# Patient Record
Sex: Female | Born: 1940 | Race: White | Hispanic: No | Marital: Single | Smoking: Never smoker
Health system: Southern US, Community
[De-identification: ages and names within clinical notes are randomized; demographics above are authoritative.]

---

## 2016-04-30 ENCOUNTER — Encounter: Payer: Self-pay | Admitting: *Deleted

## 2016-04-30 ENCOUNTER — Ambulatory Visit
Admission: EM | Admit: 2016-04-30 | Discharge: 2016-04-30 | Disposition: A | Discharge status: Home / Self Care | Payer: Medicare Other | Attending: Internal Medicine | Admitting: Internal Medicine

## 2016-04-30 DIAGNOSIS — J111 Influenza due to unidentified influenza virus with other respiratory manifestations: Secondary | ICD-10-CM

## 2016-04-30 DIAGNOSIS — R69 Illness, unspecified: Secondary | ICD-10-CM | POA: Diagnosis not present

## 2016-04-30 LAB — RAPID INFLUENZA A&B ANTIGENS: Influenza B (ARMC): NEGATIVE

## 2016-04-30 LAB — RAPID STREP SCREEN (MED CTR MEBANE ONLY): Streptococcus, Group A Screen (Direct): NEGATIVE

## 2016-04-30 LAB — RAPID INFLUENZA A&B ANTIGENS (ARMC ONLY): INFLUENZA A (ARMC): NEGATIVE

## 2016-04-30 MED ORDER — BENZONATATE 200 MG PO CAPS: 200.0000 mg | ORAL_CAPSULE | Freq: Three times a day (TID) | ORAL | 1 refills | Status: AC | PRN

## 2016-04-30 MED ORDER — PREDNISONE 50 MG PO TABS: 50.0000 mg | ORAL_TABLET | Freq: Every day | ORAL | 0 refills | Status: DC

## 2016-04-30 MED ORDER — TRIAMCINOLONE ACETONIDE 55 MCG/ACT NA AERO: 2.0000 | INHALATION_SPRAY | Freq: Every day | NASAL | 0 refills | Status: AC

## 2016-04-30 NOTE — Discharge Instructions (Addendum)
Rapid strep test and influenza tests were negative.  Symptoms today seem most consistent with an influenza-like illness, probably viral, causing sinus and chest congestion and drainage.  Prescriptions for prednisone (steroid, for chest tightness/cough/congestion), benzonatate (for cough) and a nasal steroid spray (for sinus congestion) were sent to the [facility].  Recheck for persistent fever >100.5, increasing phlegm production/nasal discharge, or if not starting to feel better in a few days.   °

## 2016-04-30 NOTE — ED Triage Notes (Signed)
C/o sore throat, fever, chills, body aches, productive cough- yellow x3-4 days.

## 2016-04-30 NOTE — ED Provider Notes (Signed)
[facility]    CSN: [id number] Arrival date & time: 04/30/16  1125     History   Chief Complaint Chief Complaint  Patient presents with  . Cough  . Fever  . Sore Throat  . Otalgia    HPI Sharon Fernandez is a 75 y.o. female. She presents today with 3-4 d hx bad head congestion, mouth breathing, chest tightness.  Malaise.  Sore throat.  Hoarseness.  Achiness and chills.  Bad headaches, tactile temp.  No vomiting.  Little bit of diarrhea.  Runny nose.     HPI  History reviewed. No pertinent past medical history.   Past Surgical History:  Procedure Laterality Date  . CESAREAN SECTION         Home Medications    Prior to Admission medications   Medication Sig Start Date End Date Taking? Authorizing Provider  alendronate (FOSAMAX) 35 MG tablet Take 35 mg by mouth every 7 (seven) days. Take with a full glass of water on an empty stomach.   Yes Historical Provider, MD  sertraline (ZOLOFT) 50 MG tablet Take 50 mg by mouth daily.   Yes Historical Provider, MD  benzonatate (TESSALON) 200 MG capsule Take 1 capsule (200 mg total) by mouth 3 (three) times daily as needed for cough. 04/30/16   Eustace MooreLaura W Syana Degraffenreid, MD  predniSONE (DELTASONE) 50 MG tablet Take 1 tablet (50 mg total) by mouth daily. 04/30/16   Eustace MooreLaura W Sabriya Yono, MD  triamcinolone (NASACORT AQ) 55 MCG/ACT AERO nasal inhaler Place 2 sprays into the nose daily. 04/30/16   Eustace MooreLaura W Flavia Bruss, MD    Family History History reviewed. No pertinent family history.  Social History Social History  Substance Use Topics  . Smoking status: Never Smoker  . Smokeless tobacco: Never Used  . Alcohol use No     Allergies   Patient has no known allergies.   Review of Systems Review of Systems  All other systems reviewed and are negative.    Physical Exam Triage Vital Signs ED Triage Vitals  Enc Vitals Group     BP 04/30/16 1335 131/69     Pulse Rate 04/30/16 1335 (!) 103     Resp 04/30/16 1335 20     Temp 04/30/16  1335 99.2 F (37.3 C)     Temp Source 04/30/16 1335 Oral     SpO2 04/30/16 1335 94 %     Weight 04/30/16 1337 174 lb (78.9 kg)     Height 04/30/16 1337 5\' 3"  (1.6 m)     Pain Score --    Updated Vital Signs BP 131/69 (BP Location: Left Arm)   Pulse (!) 103   Temp 99.2 F (37.3 C) (Oral)   Resp 20   Ht 5\' 3"  (1.6 m)   Wt 174 lb (78.9 kg)   SpO2 94%   BMI 30.82 kg/m  Physical Exam  Constitutional: She is oriented to person, place, and time.  Alert, nicely groomed Looks ill but not toxic  HENT:  Head: Atraumatic.  B TMs are quite dull, red-tinged Marked nasal congestion Throat red, with post nasal drainage  Eyes:  Conjugate gaze, no eye redness/drainage  Neck: Neck supple.  Cardiovascular: Normal rate and regular rhythm.   Pulmonary/Chest: No respiratory distress.  Slightly coarse but symmetric breath sounds throughout  Abdominal: She exhibits no distension.  Musculoskeletal: Normal range of motion.  No leg swelling  Neurological: She is alert and oriented to person, place, and time.  Skin: Skin is warm and  dry.  No cyanosis  Nursing note and vitals reviewed.    UC Treatments / Results  Labs Results for orders placed or performed during the hospital encounter of 04/30/16  Rapid Influenza A&B Antigens ([facility] only)  Result Value Ref Range   Influenza A ([facility]) NEGATIVE NEGATIVE   Influenza B ([facility]) NEGATIVE NEGATIVE  Rapid strep screen  Result Value Ref Range   Streptococcus, Group A Screen (Direct) NEGATIVE NEGATIVE  Culture, group A strep  Result Value Ref Range   Specimen Description THROAT    Special Requests NONE Reflexed from [id number]    Culture      NO GROUP A STREP (S.PYOGENES) ISOLATED [facility]    Report Status 05/02/2016 FINAL     Procedures Procedures (including critical care time) None today  Final Clinical Impressions(s) / UC Diagnoses   Final diagnoses:  Influenza-like illness   Rapid strep test and influenza tests  were negative.  Symptoms today seem most consistent with an influenza-like illness, probably viral, causing sinus and chest congestion and drainage.  Prescriptions for prednisone (steroid, for chest tightness/cough/congestion), benzonatate (for cough) and a nasal steroid spray (for sinus congestion) were sent to the [facility].  Recheck for persistent fever >100.5, increasing phlegm production/nasal discharge, or if not starting to feel better in a few days.    New Prescriptions Discharge Medication List as of 04/30/2016  2:23 PM    START taking these medications   Details  benzonatate (TESSALON) 200 MG capsule Take 1 capsule (200 mg total) by mouth 3 (three) times daily as needed for cough., Starting Tue 04/30/2016, Normal    predniSONE (DELTASONE) 50 MG tablet Take 1 tablet (50 mg total) by mouth daily., Starting Tue 04/30/2016, Normal    triamcinolone (NASACORT AQ) 55 MCG/ACT AERO nasal inhaler Place 2 sprays into the nose daily., Starting Tue 04/30/2016, Normal         Eustace MooreLaura W Donovon Micheletti, MD 05/03/16 1009

## 2016-05-02 LAB — CULTURE, GROUP A STREP (THRC)

## 2016-05-03 ENCOUNTER — Ambulatory Visit
Admission: EM | Admit: 2016-05-03 | Discharge: 2016-05-03 | Disposition: A | Discharge status: Home / Self Care | Payer: Medicare Other | Attending: Family Medicine | Admitting: Family Medicine

## 2016-05-03 ENCOUNTER — Ambulatory Visit (INDEPENDENT_AMBULATORY_CARE_PROVIDER_SITE_OTHER): Payer: Medicare Other

## 2016-05-03 DIAGNOSIS — J181 Lobar pneumonia, unspecified organism: Principal | ICD-10-CM

## 2016-05-03 DIAGNOSIS — J189 Pneumonia, unspecified organism: Secondary | ICD-10-CM | POA: Diagnosis not present

## 2016-05-03 MED ORDER — PREDNISONE 10 MG (21) PO TBPK: ORAL_TABLET | ORAL | 0 refills | Status: DC

## 2016-05-03 MED ORDER — HYDROCOD POLST-CPM POLST ER 10-8 MG/5ML PO SUER: 5.0000 mL | Freq: Two times a day (BID) | ORAL | 0 refills | Status: AC | PRN

## 2016-05-03 MED ORDER — ALBUTEROL SULFATE HFA 108 (90 BASE) MCG/ACT IN AERS: 2.0000 | INHALATION_SPRAY | RESPIRATORY_TRACT | 1 refills | Status: AC | PRN

## 2016-05-03 MED ORDER — LEVOFLOXACIN 500 MG PO TABS: 500.0000 mg | ORAL_TABLET | Freq: Every day | ORAL | 0 refills | Status: DC

## 2016-05-03 NOTE — ED Provider Notes (Signed)
[facility]    CSN: [id number] Arrival date & time: 05/03/16  1130     History   Chief Complaint Chief Complaint  Patient presents with  . Cough    HPI Sharon Fernandez is a 75 y.o. female.   Patient's here because of cough and congestion she reports having this cough and congestion that started a day to before Christmas. She was seen here the day after Christmas given medication for viral URI she states that things are just gotten worse she feels worse she feels a lot worse in fact she reports increased coughing and nasal congestion and low-grade fever. She reports a nonproductive hacking cough is keeping her up aching all over and feeling miserable.   The history is provided by the patient. No language interpreter was used.  Cough  Cough characteristics:  Non-productive Sputum characteristics:  Unable to specify Severity:  Moderate Onset quality:  Sudden Timing:  Constant Progression:  Worsening Chronicity:  New Smoker: no   Context: sick contacts and upper respiratory infection   Relieved by:  Nothing Worsened by:  Deep breathing and lying down Ineffective treatments:  None tried Associated symptoms: myalgias, shortness of breath, sinus congestion and wheezing   Associated symptoms: no chest pain, no chills, no diaphoresis, no ear fullness, no ear pain, no sore throat and no weight loss     History reviewed. No pertinent past medical history.  There are no active problems to display for this patient.   Past Surgical History:  Procedure Laterality Date  . CESAREAN SECTION      OB History    No data available       Home Medications    Prior to Admission medications   Medication Sig Start Date End Date Taking? Authorizing Provider  alendronate (FOSAMAX) 35 MG tablet Take 35 mg by mouth every 7 (seven) days. Take with a full glass of water on an empty stomach.   Yes Historical Provider, MD  benzonatate (TESSALON) 200 MG capsule Take 1 capsule (200  mg total) by mouth 3 (three) times daily as needed for cough. 04/30/16  Yes Eustace Moore, MD  predniSONE (DELTASONE) 50 MG tablet Take 1 tablet (50 mg total) by mouth daily. 04/30/16  Yes Eustace Moore, MD  sertraline (ZOLOFT) 50 MG tablet Take 50 mg by mouth daily.   Yes Historical Provider, MD  triamcinolone (NASACORT AQ) 55 MCG/ACT AERO nasal inhaler Place 2 sprays into the nose daily. 04/30/16  Yes Eustace Moore, MD  albuterol (PROVENTIL HFA;VENTOLIN HFA) 108 (90 Base) MCG/ACT inhaler Inhale 2 puffs into the lungs every 4 (four) hours as needed for wheezing or shortness of breath. 05/03/16   Hassan Rowan, MD  chlorpheniramine-HYDROcodone [facility] PENNKINETIC ER) 10-8 MG/5ML SUER Take 5 mLs by mouth every 12 (twelve) hours as needed for cough. 05/03/16   Hassan Rowan, MD  levofloxacin (LEVAQUIN) 500 MG tablet Take 1 tablet (500 mg total) by mouth daily. 05/03/16   Hassan Rowan, MD  predniSONE (STERAPRED UNI-PAK 21 TAB) 10 MG (21) TBPK tablet Sig 6 tablet day 1, 5 tablets day 2, 4 tablets day 3,,3tablets day 4, 2 tablets day 5, 1 tablet day 6 take all tablets orally 05/03/16   Hassan Rowan, MD    Family History History reviewed. No pertinent family history.  Social History Social History  Substance Use Topics  . Smoking status: Never Smoker  . Smokeless tobacco: Never Used  . Alcohol use No     Allergies  Patient has no known allergies.   Review of Systems Review of Systems  Constitutional: Negative for chills, diaphoresis and weight loss.  HENT: Negative for ear pain and sore throat.   Respiratory: Positive for cough, shortness of breath and wheezing.   Cardiovascular: Negative for chest pain.  Musculoskeletal: Positive for myalgias.  All other systems reviewed and are negative.    Physical Exam Triage Vital Signs ED Triage Vitals  Enc Vitals Group     BP 05/03/16 1230 117/62     Pulse Rate 05/03/16 1230 95     Resp 05/03/16 1230 19     Temp 05/03/16 1230 98.1 F  (36.7 C)     Temp Source 05/03/16 1230 Oral     SpO2 05/03/16 1230 92 %     Weight 05/03/16 1228 174 lb (78.9 kg)     Height 05/03/16 1228 5\' 3"  (1.6 m)     Head Circumference --      Peak Flow --      Pain Score 05/03/16 1230 6     Pain Loc --      Pain Edu? --      Excl. in GC? --    No data found.   Updated Vital Signs BP 117/62 (BP Location: Left Arm)   Pulse 95   Temp 98.1 F (36.7 C) (Oral)   Resp 19   Ht 5\' 3"  (1.6 m)   Wt 174 lb (78.9 kg)   SpO2 92%   BMI 30.82 kg/m   Visual Acuity Right Eye Distance:   Left Eye Distance:   Bilateral Distance:    Right Eye Near:   Left Eye Near:    Bilateral Near:     Physical Exam  Constitutional: She is oriented to person, place, and time. She appears well-developed and well-nourished.  HENT:  Head: Normocephalic and atraumatic.  Eyes: Pupils are equal, round, and reactive to light.  Neck: Normal range of motion. No thyromegaly present.  Cardiovascular: Normal rate.   Pulmonary/Chest: Effort normal. She has wheezes.  Musculoskeletal: Normal range of motion. She exhibits no edema or deformity.  Neurological: She is alert and oriented to person, place, and time. No cranial nerve deficit.  Skin: Skin is warm.  Psychiatric: She has a normal mood and affect.  Vitals reviewed.    UC Treatments / Results  Labs (all labs ordered are listed, but only abnormal results are displayed) Labs Reviewed - No data to display  EKG  EKG Interpretation None       Radiology Dg Chest 2 View  Result Date: 05/03/2016 CLINICAL DATA:  Shortness of breath and productive cough for 1 week. EXAM: CHEST  2 VIEW COMPARISON:  None. FINDINGS: The cardiac silhouette, mediastinal and hilar contours are within normal limits. Mild tortuosity of the thoracic aorta. There are bilateral basilar infiltrates. Linear scarring or atelectasis noted in the right lung. No pleural effusion. The bony thorax is intact. IMPRESSION: Bibasilar infiltrates.  Electronically Signed   By: Rudie MeyerP.  Gallerani M.D.   On: 05/03/2016 13:29    Procedures Procedures (including critical care time)  Medications Ordered in UC Medications - No data to display   Initial Impression / Assessment and Plan / UC Course  I have reviewed the triage vital signs and the nursing notes.  Pertinent labs & imaging results that were available during my care of the patient were reviewed by me and considered in my medical decision making (see chart for details).  Clinical Course  Patient informed about the abnormal chest x-ray indicating bibasilar pneumonia. Because of the pneumonia will place on Levaquin 500 mg 1 tablet day for 10 days albuterol inhaler 2 puffs every 2-4 hours when necessary as needed for bronchospasm Tussionex 1 teaspoon twice a day and will place on 6 day course of prednisone. She supposed to try file next Wednesday but explained to the following Monday fluids and notify their lines and get another flight out. Pop PCP when she is back to [place] in 2 weeks.   Final Clinical Impressions(s) / UC Diagnoses   Final diagnoses:  Pneumonia of both lower lobes due to infectious organism  Community acquired pneumonia, unspecified laterality    New Prescriptions Discharge Medication List as of 05/03/2016  2:04 PM    START taking these medications   Details  albuterol (PROVENTIL HFA;VENTOLIN HFA) 108 (90 Base) MCG/ACT inhaler Inhale 2 puffs into the lungs every 4 (four) hours as needed for wheezing or shortness of breath., Starting Fri 05/03/2016, Normal    chlorpheniramine-HYDROcodone (TUSSIONEX PENNKINETIC ER) 10-8 MG/5ML SUER Take 5 mLs by mouth every 12 (twelve) hours as needed for cough., Starting Fri 05/03/2016, Normal    levofloxacin (LEVAQUIN) 500 MG tablet Take 1 tablet (500 mg total) by mouth daily., Starting Fri 05/03/2016, Normal    predniSONE (STERAPRED UNI-PAK 21 TAB) 10 MG (21) TBPK tablet Sig 6 tablet day 1, 5 tablets day 2, 4 tablets day  3,,3tablets day 4, 2 tablets day 5, 1 tablet day 6 take all tablets orally, Normal         Note: This dictation was prepared with Dragon dictation along with smaller phrase technology. Any transcriptional errors that result from this process are unintentional.   Hassan RowanEugene Marabeth Melland, MD 05/03/16 [phone]

## 2016-05-03 NOTE — ED Triage Notes (Signed)
Patient complains of cough and congestion and sore throat with body aches x 7 days. Patient states that she feels very weak. Patient states that she has not improved since last visit here on Tuesday.

## 2016-05-14 ENCOUNTER — Ambulatory Visit
Admission: EM | Admit: 2016-05-14 | Discharge: 2016-05-14 | Disposition: A | Discharge status: Home / Self Care | Payer: Medicare Other | Attending: Family Medicine | Admitting: Family Medicine

## 2016-05-14 ENCOUNTER — Encounter: Payer: Self-pay | Admitting: Emergency Medicine

## 2016-05-14 DIAGNOSIS — M549 Dorsalgia, unspecified: Secondary | ICD-10-CM | POA: Insufficient documentation

## 2016-05-14 DIAGNOSIS — R0602 Shortness of breath: Secondary | ICD-10-CM | POA: Diagnosis not present

## 2016-05-14 NOTE — ED Provider Notes (Signed)
[facility] ____________________________________________  Time seen: Approximately 12:31 PM  I have reviewed the triage vital signs and the nursing notes.   HISTORY  Chief Complaint Cough and Back Pain   HPI Sharon Fernandez is a 76 y.o. female presenting with son at bedside for evaluation of back pain. Patient reports recent urgent care visits, initially diagnosed with a viral illness, and then diagnosed with bilateral pneumonia on 05/03/2016. Patient reports she finished her antibiotic prescription this past weekend which was Levaquin. Patient reports she is also since finish her cough medication. Patient reports she does still intermittently use inhaler. Patient reports this past Monday during the night she had onset of right lower "lung pain". Patient reports the next day she then had bilateral" lung pain ". Patient reports that she does still have some coughing but that the cough is much improved. Reports some accompanying shortness of breath. She reports that pain is present with deep breath to her back. Patient reports pain does improve with standing upright, but states pain is not increased with movement.  Denies fevers this week or chills. Denies lower extremity swelling or pain. Denies fall or injury. Denies heavy lifting or known trigger for pain. Denies history of same. Denies history of kidney stones or renal issues. Denies chest pain, rash, neck or lower back pain, dysuria or abdominal pain. Reports continues to drink and eat well overall. Patient reports she has not been as active over the last few weeks with her recent sickness. Reports flu to [place] just prior to Christmas and is planning to return home soon.  PCP: In [place]   Past medical history. Anxiety  There are no active problems to display for this patient.   Past Surgical History:  Procedure Laterality Date  . CESAREAN SECTION      No current facility-administered medications for this  encounter.   Current Outpatient Prescriptions:  .  albuterol (PROVENTIL HFA;VENTOLIN HFA) 108 (90 Base) MCG/ACT inhaler, Inhale 2 puffs into the lungs every 4 (four) hours as needed for wheezing or shortness of breath., Disp: 1 Inhaler, Rfl: 1 .  alendronate (FOSAMAX) 35 MG tablet, Take 35 mg by mouth every 7 (seven) days. Take with a full glass of water on an empty stomach., Disp: , Rfl:  .  benzonatate (TESSALON) 200 MG capsule, Take 1 capsule (200 mg total) by mouth 3 (three) times daily as needed for cough., Disp: 30 capsule, Rfl: 1 .  chlorpheniramine-HYDROcodone (TUSSIONEX PENNKINETIC ER) 10-8 MG/5ML SUER, Take 5 mLs by mouth every 12 (twelve) hours as needed for cough., Disp: 115 mL, Rfl: 0 .  sertraline (ZOLOFT) 50 MG tablet, Take 50 mg by mouth daily., Disp: , Rfl:  .  triamcinolone (NASACORT AQ) 55 MCG/ACT AERO nasal inhaler, Place 2 sprays into the nose daily., Disp: 1 Inhaler, Rfl: 0  Allergies Patient has no known allergies.  family history Father: High blood pressure Denies family history or personal history of DVT or PEs.  Social History Social History  Substance Use Topics  . Smoking status: Never Smoker  . Smokeless tobacco: Never Used  . Alcohol use No    Review of Systems Constitutional: As above.  Eyes: No visual changes. ENT: No sore throat. Cardiovascular: Denies chest pain. Respiratory:  As above.  Gastrointestinal: No abdominal pain.  No nausea, no vomiting.  No diarrhea.  No constipation. Genitourinary: Negative for dysuria. Musculoskeletal: Negative for back pain. Skin: Negative for rash. Neurological: Negative for headaches, focal weakness or numbness.  10-point ROS  otherwise negative.  ____________________________________________   PHYSICAL EXAM:  VITAL SIGNS: ED Triage Vitals  Enc Vitals Group     BP 05/14/16 1027 125/65     Pulse Rate 05/14/16 1027 91     Resp 05/14/16 1027 17     Temp 05/14/16 1027 97.6 F (36.4 C)     Temp Source  05/14/16 1027 Oral     SpO2 05/14/16 1027 97 %     Weight 05/14/16 1027 167 lb (75.8 kg)     Height 05/14/16 1027 5\' 3"  (1.6 m)     Head Circumference --      Peak Flow --      Pain Score 05/14/16 1029 0     Pain Loc --      Pain Edu? --      Excl. in GC? --     Constitutional: Alert and oriented. Well appearing and in no acute distress. Eyes: Conjunctivae are normal. PERRL. EOMI. ENT      Head: Normocephalic and atraumatic.      Nose: No congestion/rhinnorhea.      Mouth/Throat: Mucous membranes are moist.Oropharynx non-erythematous. Neck: No stridor. Supple without meningismus.  Hematological/Lymphatic/Immunilogical: No cervical lymphadenopathy. Cardiovascular: Normal rate, regular rhythm. Grossly normal heart sounds.  Good peripheral circulation. Respiratory: Normal respiratory effort without tachypnea nor retractions. Breath sounds are clear and equal bilaterally. No wheezes/rales/rhonchi.. Gastrointestinal: Soft and nontender. No distention.  No CVA tenderness. Musculoskeletal:  Steady gait. No midline cervical, thoracic or lumbar tenderness to palpation. Bilateral pedal pulses equal and easily palpated. No back tenderness to palpation, no pain with bending or overhead stretching.      Right lower leg:  No tenderness or edema.      Left lower leg:  No tenderness or edema.  Neurologic:  Normal speech and language.Speech is normal. No gait instability.  Skin:  Skin is warm, dry and intact. No rash noted. Psychiatric: Mood and affect are normal. Speech and behavior are normal. Patient exhibits appropriate insight and judgment   ___________________________________________   LABS (all labs ordered are listed, but only abnormal results are displayed)  Labs Reviewed - No data to display ____________________________________________  EKG  ED ECG REPORT I, Renford Dills, the attending physician, personally viewed and interpreted this ECG.   Date: 05/14/2016  EKG Time: 1036   Rate: 78  Rhythm: normal EKG, normal sinus rhythm, no previous EKG available for comparison.  Axis: normal  Intervals:none  ST&T Change: no change noted  ____________________________________________  RADIOLOGY  No results found. ____________________________________________   PROCEDURES Procedures   INITIAL IMPRESSION / ASSESSMENT AND PLAN / ED COURSE  Pertinent labs & imaging results that were available during my care of the patient were reviewed by me and considered in my medical decision making (see chart for details).  Overall well-appearing patient. However patient appears with pain in her back as well has some shortness of breath. Patient recently with pneumonia and completed antibiotics. However patient also with recent flight as well as more sedentary lifestyle changes. Patient pain is nonreproducible by direct palpation as well as movements. Lungs clear throughout. Due to report of pain and with recent sickness, discussed in detail with patient and son concerning for pulmonary emboli. Patient declines further workup in urgent care at this time including x-ray and blood work and states that she will have it performed in the ER. Discussed in detail with patient and son recommended for patient to be further evaluated in emergency room of their choice. Patient's son reports  [facility] Melissa CMA called and report given. Patient and son reports that son will drive him directly there. Patient stable at the time of discharge.  Discussed follow up with Primary care physician this week. Discussed follow up and return parameters including no resolution or any worsening concerns. Patient verbalized understanding and agreed to plan.   ____________________________________________   FINAL CLINICAL IMPRESSION(S) / ED DIAGNOSES  Final diagnoses:  Acute bilateral back pain, unspecified back location  Shortness of breath     Discharge Medication List as of 05/14/2016  12:48 PM      Note: This dictation was prepared with Dragon dictation along with smaller phrase technology. Any transcriptional errors that result from this process are unintentional.    Clinical Course       Renford DillsLindsey Shanik [place], NP 05/14/16 1720    Renford DillsLindsey Dalexa Gentz, NP 05/14/16 1721

## 2016-05-14 NOTE — ED Triage Notes (Signed)
Patient was diagnosed with Pneumonia on 05/03/2016.  Patient c/o back pain that started last night.  Patient c/o ongoing cough that has improved.

## 2016-05-14 NOTE — Discharge Instructions (Signed)
Go directly to Emergency room as discussed.  °

## 2017-05-15 IMAGING — CR DG CHEST 2V
2 series · 2 of 2 positions shown · non-contrast
Comparison: None.

CLINICAL DATA: Shortness of breath and productive cough for 1 week.

EXAM:
CHEST  2 VIEW

[chest pa]
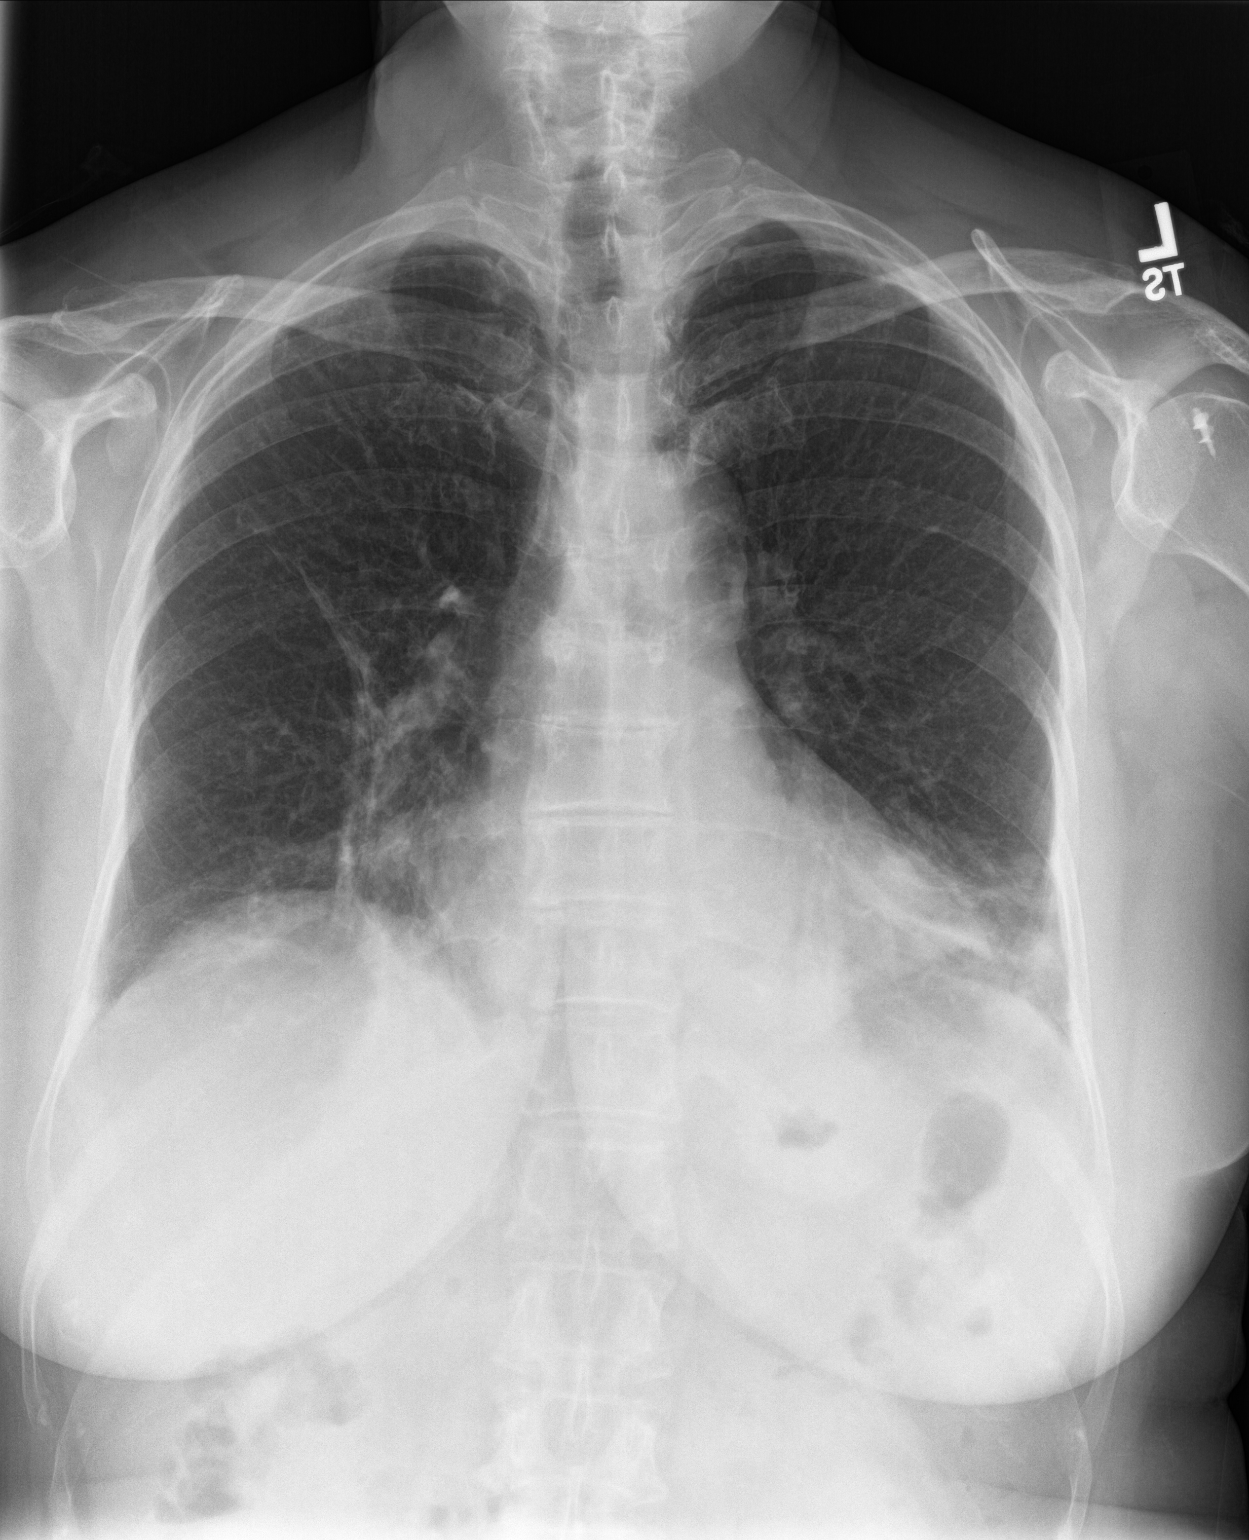

[chest lat]
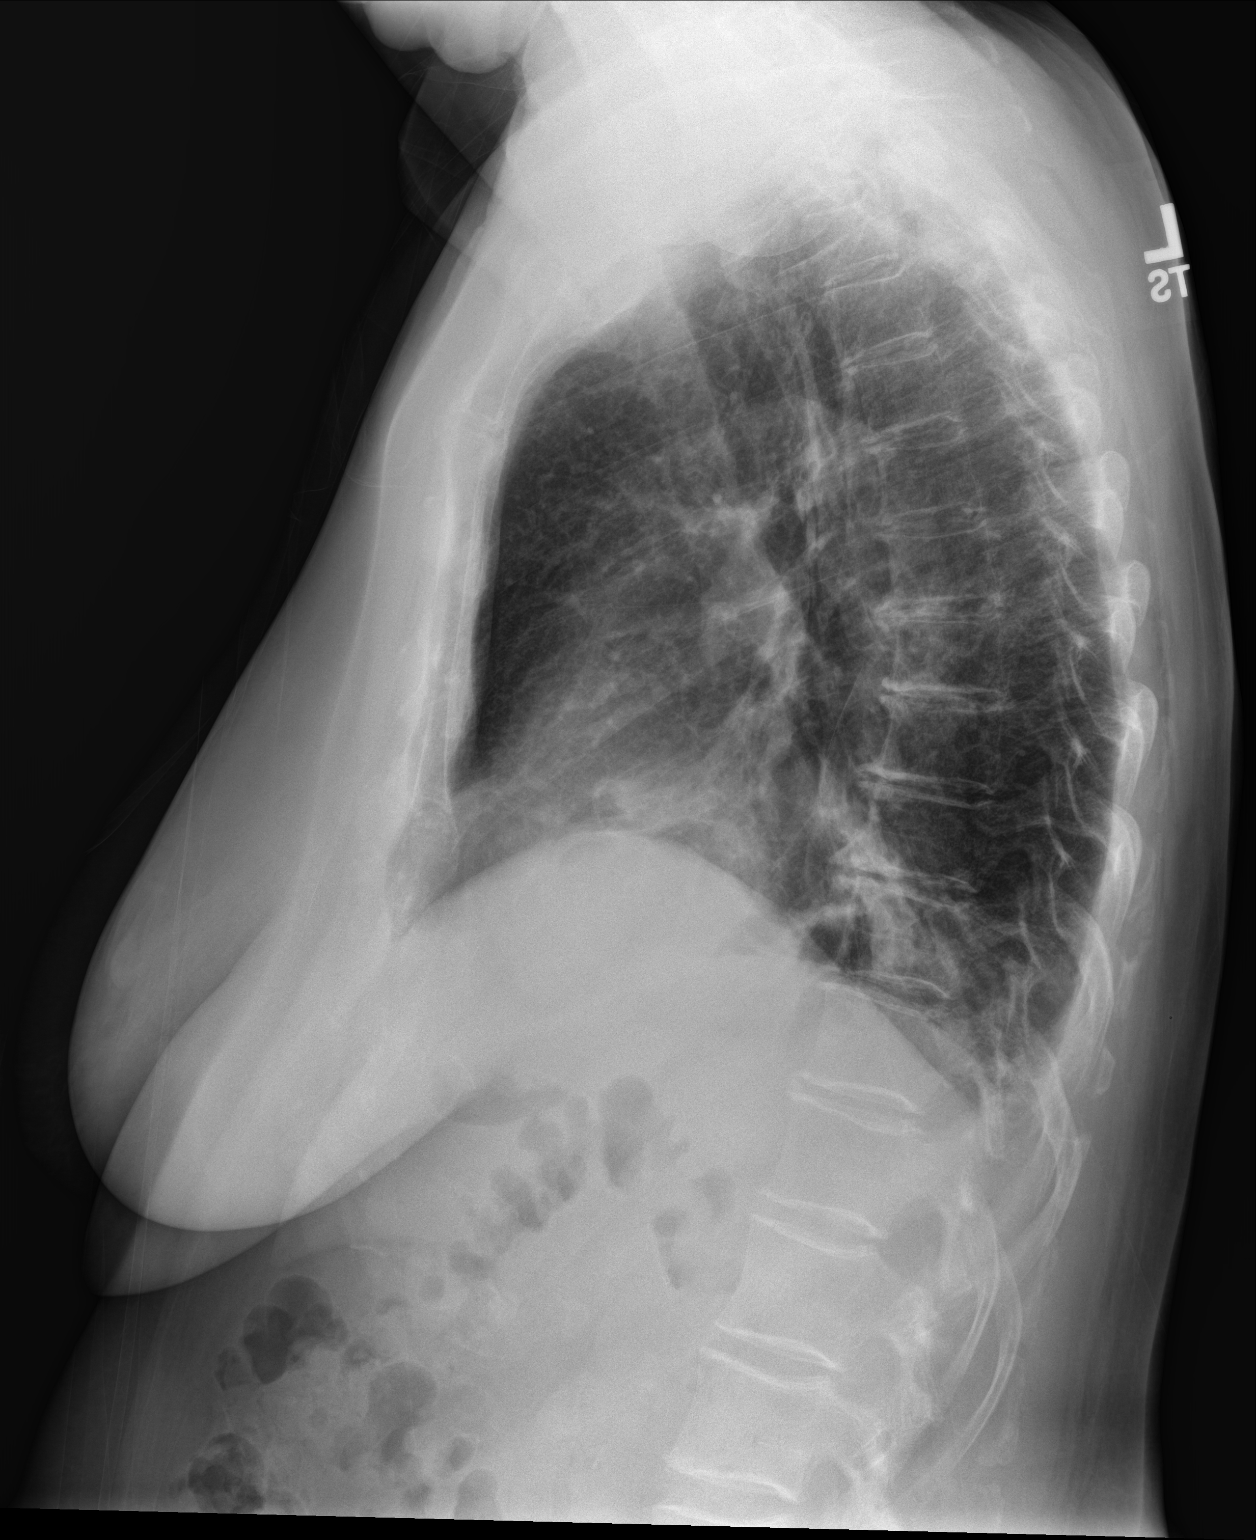

[2 of 2 positions shown; findings below may reference images not displayed]

FINDINGS: The cardiac silhouette, mediastinal and hilar contours are within
normal limits. Mild tortuosity of the thoracic aorta. There are
bilateral basilar infiltrates. Linear scarring or atelectasis noted
in the right lung. No pleural effusion. The bony thorax is intact.
IMPRESSION: Bibasilar infiltrates.

## 2020-10-12 ENCOUNTER — Telehealth: Payer: PRIVATE HEALTH INSURANCE

## 2020-10-12 NOTE — Telephone Encounter
Call Back Request      Reason for call back: Patients daughter Ezequiel Essex called to confirm receipt of fax for authorization for her mother to see an ortho spine for low back pain. Please contact patients daughter to confirm it was received and if she can schedule at [phone]. Thank you.    Any Symptoms:  []  Yes  [x]  No       If yes, what symptoms are you experiencing:    o Duration of symptoms (how long):    o Have you taken medication for symptoms (OTC or Rx):      Patient or caller has been notified of the 24-48 hour turnaround time.

## 2020-10-13 NOTE — Telephone Encounter
Call Back Request      Reason for call back: Pt daughter calling back for an update. She would like to confirm if referral was received.    Any Symptoms:  []  Yes  [x]  No       If yes, what symptoms are you experiencing:    o Duration of symptoms (how long):    o Have you taken medication for symptoms (OTC or Rx):      Patient or caller has been notified of the 24-48 hour turnaround time.

## 2020-10-13 NOTE — Telephone Encounter
Reply by: Higinio Plan  Left voicemail for both patient and daughter informing them that upon reviewing all incoming faxes, we have not received any referrals/authorizations on the patient's behalf. They were instructed to have the referral information faxed to Fax: [phone].

## 2020-10-13 NOTE — Telephone Encounter
PDL Call to Practice    Reason for Call: Patient's daughter Ezequiel Essex called back in requesting to confirm if her mother's referral was received and confirmed it was faxed to [phone]. Reached out to [facility] who confirmed referral and records were received however, an authorization was not received. Relayed information back to Jocleyn who expressed her high frustration due to  having to go back and forth and between [place] and her [employer] and emphasized all she wants to do is schedule her mother. Jocelyn stated she will be reaching out again to [facility] and would like a call back once Berkley Harvey is received. Please advise, thank you!     Appointment Related?  [x]  Yes  []  No     If yes;  Date: TBD   Time: TBD    Call warm transferred to PDL: []  Yes  []  No    Call Received by Practice Representative:

## 2020-10-13 NOTE — Telephone Encounter
Forwarded by: Eastyn Skalla

## 2020-10-16 NOTE — Telephone Encounter
Reply by: Molli Hazard Vickey Boak  Looks like referral has been scanned into [facility] system. Patient may be scheduled, she will need to hand carry in images on CD to appointment.

## 2020-10-16 NOTE — Telephone Encounter
Call Back Request      Reason for call back: Patients daughter Ezequiel Essex is requesting a call back to confirm receipt of fax of authorization for her mother to see a spine Doctor. Please advise daughter once it has been received. Thank you.    Any Symptoms:  []  Yes  [x]  No       If yes, what symptoms are you experiencing:    o Duration of symptoms (how long):    o Have you taken medication for symptoms (OTC or Rx):      Patient or caller has been notified of the 24-48 hour turnaround time.

## 2020-10-19 ENCOUNTER — Ambulatory Visit: Payer: PRIVATE HEALTH INSURANCE | Attending: Acute Care

## 2020-10-25 ENCOUNTER — Ambulatory Visit: Payer: MEDICARE

## 2020-10-25 ENCOUNTER — Inpatient Hospital Stay: Payer: MEDICARE

## 2020-10-25 DIAGNOSIS — Z719 Counseling, unspecified: Secondary | ICD-10-CM

## 2020-10-25 DIAGNOSIS — M545 Acute midline low back pain without sciatica: Secondary | ICD-10-CM

## 2020-10-25 DIAGNOSIS — S32030S Wedge compression fracture of third lumbar vertebra, sequela: Secondary | ICD-10-CM

## 2020-10-25 NOTE — H&P
[facility], [place]  Department of Neurosurgery      Consultation Note     Primary Care Physician: Sistoza, Joen Laura, MD  Referring Practitioner: Rogelia Rohrer *  Attending Physician: Bernadene Bell MD      Chief Complaint:   Chief Complaint   Patient presents with   ? Lumbar Spine     Date of Service: 10/25/2020    History:     Interval History:  Kaitlyn Willis is a 80 y.o.-year-old female who presents with back pain. She characterizes this pain as aching, pressure and dull and states that it is constant and waxes and wanes in nature.     These symptoms started approximately three weeks ago. The pain started after a fall. Azaela states that the pain is exacerbated by sitting, standing and walking. She reports that the symptoms are mitigated by lying down, medications and changing positions. Dashauna denies problems with urination, ambulation and fine motor control.    Finally, [name] is able to perform all activities of daily living with minor assistance.    Conservative Treatment History: Response to Conservative Treatment:    medications:  anti-inflammatory OTC (aspirin, Tylenol, Advil, Aleve, etc) Some improvement     Past Medical History:  No past medical history on file.    Past Surgical History:  No past surgical history on file.    Medications:  No outpatient medications prior to visit.     No facility-administered medications prior to visit.       Allergies:  Not on File    Social History:  Social History     Socioeconomic History   ? Marital status: Single       Family History:  No family history on file.    Smoking History:  no    Review of Systems:  A 14-system review was obtained and reviewed by me (see chart).  Please refer to the new patient questionnaire dated 10/25/2020 for full details of the ROS.    Physical Exam:   There were no vitals taken for this visit.    General: Well appearing, no acute distress  Psych: Alert and oriented to person, place and time  HEENT: Normocephalic, normal sclera, neck supple  Card: regular rate and rhythm, pulses palpable, warm and well perfused  Resp: No labored breathing  GI: Abdomen benign, soft, nondistended  Extremities: No peripheral edema or digital cyanosis  Skin: intact, no rashes or lesions    Lower Extremities:  Inspection: Lumbar lordosis is grossly normal on visual inspection.    Palpation: There is no tenderness to palpation at the thoracic/lumbar. No palpable step-off appreciated.    ROM: Symmetric and within normal limits.    Motor  L2-S1 Hip Flex Quad TA EHL GS   RLE 5 5 5 5 5    LLE 5 5 5 5 5    Sensation  Sensation in L2-S1 dermatomes is intact bilaterally and symmetrically to light touch.  Straight-leg raise:  not performed/applicable.    Reflexes:  Patella deep tendon reflexes are 2+ on the left, 2+ on the right  Achilles deep tendon reflexes are 2+ on the left, 2+ on the right  Babinski reflex:  negative bilaterally  Clonus: negative    Vascular: 1+ dp/pt pulse, warm and well perfused.    Gait: Gait is Antalgic.    Diagnostic Imaging:   I personally reviewed the imaging studies (not just the reports) and went over them in detail with the patient at the clinic  visit today.  My impression of the imaging is as follows:    XR Lumbar Spine: Images demonstrate:  IMPRESSION:   There is a subacute compression fracture the superior endplate of the L3 vertebral body with mild height loss. There is healing sclerosis.?Spinal alignment is maintained, and unchanged in flexion/extension.      MRI Lumbar Spine: Review of the images provided by the patient during this visit demonstrate:         Assessment/Plan:   Impression:  Kaitlyn Willis is a 80 y.o.-year-old female who presents with compression fracture of her L3 vertebral body.    Plan:  DME order for supportive brace ordered  PT / Aquatherapy ordered  [facility] ordered for acupuncture  Pain management consult ordered for evaluation/treatment of low back and hip pain    I discussed the natural history of lumbar compression fracture with the patient in detail. I discussed the various treatment options, including nonoperative and operative treatment with the patient today who communicated understanding.  Patient's current medications were reviewed with the patient.    We will see the patient back in 6-7 weeks for continued care. The patient was encouraged to call us with any questions or problems in the interim.    60 minutes were spent personally by me today on this encounter which may include today?s pre-visit review of the chart, time spent during the visit, and today?s time spent after the visit documenting and coordinating care. All questions were answered and Muslima Toppins understood and was satisfied with this plan.     Thank you Dr. Louie Boston for allowing [place] to participate in the care of Kaitlyn Willis.    Vania Rea. Kingson Lohmeyer assisted in the evaluation of this patient, which was directly supervised by Dr. Bernadene Bell. the simulation scans have been obtained''}. The patient was encouraged to call us with any questions or problems in the interim.    {Blank single:19197::''30'',''45'',''60''} minutes were spent personally by me today on this encounter which may include today?s pre-visit review of the chart, time spent during the visit, and today?s time spent after the visit documenting and coordinating care. All questions were answered and Kaitlyn Willis understood and was satisfied with this plan.     Thank you Dr. Louie Boston and Dr. Louie Boston for allowing [place] to participate in the care of Kaitlyn Willis.    Vania Rea. Mayda Shippee assisted in the evaluation of this patient, which was directly supervised by {Blank single:19197::''Dr. Franky Macho Macyszyn'',''Dr. Greig Castilla Vivas'',''Dr. Francis Dowse Beckett'',''***''}.

## 2020-10-26 ENCOUNTER — Telehealth: Payer: PRIVATE HEALTH INSURANCE

## 2020-10-26 NOTE — Telephone Encounter
Call Back Request      Reason for call back: Jocelyn pts daughter is calling because mother was in yesterday to see Dr Romie Levee & they saw Marda Stalker regarding her lumbar spine.  He was going to put in an order for a back brace, so she called the ins & was told it takes a few days. They told her to reach out to the dr to have them submit it as urgent so its expedite to get the back brace in a few days vs 2 wks. Please call her ph# [phone]    Any Symptoms:  []  Yes  [x]  No       If yes, what symptoms are you experiencing:    o Duration of symptoms (how long):    o Have you taken medication for symptoms (OTC or Rx):      Patient or caller has been notified of the 24-48 hour turnaround time.

## 2020-10-27 ENCOUNTER — Ambulatory Visit: Payer: PRIVATE HEALTH INSURANCE

## 2020-10-27 ENCOUNTER — Telehealth: Payer: PRIVATE HEALTH INSURANCE

## 2020-10-27 NOTE — Addendum Note
Addended by: Curley Spice on: 10/27/2020 11:29 AM     Modules accepted: Orders

## 2020-10-27 NOTE — Telephone Encounter
Lm.am to touch base regarding where the pt will be going to obtain brace. I asked if I can obtain the name of facility along with phone number so we may forward those details and additional to our auth team.

## 2020-10-27 NOTE — Telephone Encounter
Call Back Request      Reason for call back: Patient's daughter Ezequiel Essex called in regarding patient's brace that was going to be ordered and said she wasn't sure if [facility] in [facility] or  [facility] would have the specific brace Dr.Beckett is requesting. Please advise, thank you!     Jocelyn: [phone]   [facility]:[phone]  [place]: [phone]    Any Symptoms:  []  Yes  [x]  No       If yes, what symptoms are you experiencing:    o Duration of symptoms (how long):    o Have you taken medication for symptoms (OTC or Rx):      Patient or caller has been notified of the 24-48 hour turnaround time.

## 2020-10-30 NOTE — Telephone Encounter
Lumbar spine supportive brace, Lumbar spine supportive brace called [facility] and they do have these types of braces Fax # [phone]     faxed over the order.     Called Jocelyn pts daughter and left her a voicemail informed order had been faxed.

## 2020-11-02 ENCOUNTER — Telehealth: Payer: PRIVATE HEALTH INSURANCE

## 2020-11-02 NOTE — Telephone Encounter
Call Back Request      Reason for call back: Pt's daughter called and is requesting NP Onalee Hua to please resubmit all referrals for pt to her insurance as they have not been received. Per pt's daughter, all referrals must be sent to [facility] in order to authorize. Pt's daughter would like a call back at [phone] regarding this issue.    [employer] : [phone]    Any Symptoms:  []  Yes  [x]  No       If yes, what symptoms are you experiencing:    o Duration of symptoms (how long):    o Have you taken medication for symptoms (OTC or Rx):      Patient or caller has been notified of the 24-48 hour turnaround time.

## 2020-11-02 NOTE — Telephone Encounter
Spoke with pts daughter and informed her that we had not been made aware of where she was planning on going for the PT, [facility] rehabilitation or pain management.     I advised that the [facility] and the [facility].     Yet, we would need to know where they were planning on going for the additional referrals as they were external.     I advised to contact insurance as they should be able to provide the patient with a list of contracted providers for those referrals and I would obtain the tax ID and NPI once she gave me the name of the location she would be going to.

## 2020-11-03 ENCOUNTER — Telehealth: Payer: PRIVATE HEALTH INSURANCE

## 2020-11-03 NOTE — Telephone Encounter
Call Back Request      Reason for call back: Patient's daughter called to inquire on the referrals -thru [facility] (they have not received anything )[facility] [place] Phone [phone]  Fax [phone] number available to send request to [facility]   l-spine back brace  Physical therapy  Aquatic water therapy  Acupuncture  Pain managment    Any Symptoms:  []  Yes  [x]  No       If yes, what symptoms are you experiencing:    o Duration of symptoms (how long):    o Have you taken medication for symptoms (OTC or Rx):      Patient or caller has been notified of the 24- hour turnaround time.

## 2020-12-15 ENCOUNTER — Telehealth: Payer: PRIVATE HEALTH INSURANCE

## 2020-12-15 NOTE — Telephone Encounter
Call Back Request      Reason for call back:     Patient's daughter would like to know about patient participating in [facility] physical therapy, states that she is unable to find referral in the system       Any Symptoms:  []  Yes  []  No       If yes, what symptoms are you experiencing:    o Duration of symptoms (how long):    o Have you taken medication for symptoms (OTC or Rx):      Patient or caller has been notified of the 24-48 hour turnaround time.

## 2023-10-09 ENCOUNTER — Ambulatory Visit: Payer: MEDICARE

## 2023-10-22 ENCOUNTER — Ambulatory Visit: Payer: MEDICARE
